# Patient Record
Sex: Female | Born: 1995 | Race: Black or African American | Hispanic: No | Marital: Single | Smoking: Former smoker
Health system: Southern US, Community
[De-identification: ages and names within clinical notes are randomized; demographics above are authoritative.]

## PROBLEM LIST (undated history)

## (undated) ENCOUNTER — Inpatient Hospital Stay: Payer: Self-pay

## (undated) DIAGNOSIS — Z789 Other specified health status: Secondary | ICD-10-CM

## (undated) HISTORY — PX: NO PAST SURGERIES: SHX2092

---
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
## 2015-01-28 ENCOUNTER — Other Ambulatory Visit: Payer: Self-pay | Admitting: Advanced Practice Midwife

## 2015-01-28 DIAGNOSIS — Z3689 Encounter for other specified antenatal screening: Secondary | ICD-10-CM

## 2015-02-18 ENCOUNTER — Ambulatory Visit
Admission: RE | Admit: 2015-02-18 | Discharge: 2015-02-18 | Disposition: A | Discharge status: Home / Self Care | Payer: Self-pay | Source: Ambulatory Visit | Attending: Advanced Practice Midwife | Admitting: Advanced Practice Midwife

## 2015-02-18 DIAGNOSIS — Z36 Encounter for antenatal screening of mother: Secondary | ICD-10-CM | POA: Insufficient documentation

## 2015-02-18 DIAGNOSIS — Z3689 Encounter for other specified antenatal screening: Secondary | ICD-10-CM

## 2015-03-29 ENCOUNTER — Emergency Department
Admission: EM | Admit: 2015-03-29 | Discharge: 2015-03-29 | Disposition: A | Discharge status: Home / Self Care | Payer: Medicaid Other | Attending: Emergency Medicine | Admitting: Emergency Medicine

## 2015-03-29 ENCOUNTER — Encounter: Payer: Self-pay | Admitting: *Deleted

## 2015-03-29 DIAGNOSIS — R05 Cough: Secondary | ICD-10-CM | POA: Diagnosis present

## 2015-03-29 DIAGNOSIS — J039 Acute tonsillitis, unspecified: Secondary | ICD-10-CM

## 2015-03-29 DIAGNOSIS — J069 Acute upper respiratory infection, unspecified: Secondary | ICD-10-CM | POA: Insufficient documentation

## 2015-03-29 LAB — POCT RAPID STREP A: STREPTOCOCCUS, GROUP A SCREEN (DIRECT): NEGATIVE

## 2015-03-29 MED ORDER — DEXTROMETHORPHAN HBR 15 MG/5ML PO SYRP: 10.0000 mL | ORAL_SOLUTION | Freq: Four times a day (QID) | ORAL | Status: DC | PRN

## 2015-03-29 MED ORDER — AZITHROMYCIN 250 MG PO TABS: ORAL_TABLET | ORAL | Status: DC

## 2015-03-29 NOTE — ED Notes (Signed)
Pt reports congestion and cough x 2 weeks. Pt also 6 months pregnant. No complaints with baby.

## 2015-03-29 NOTE — Discharge Instructions (Signed)
Upper Respiratory Infection, Adult Most upper respiratory infections (URIs) are a viral infection of the air passages leading to the lungs. A URI affects the nose, throat, and upper air passages. The most common type of URI is nasopharyngitis and is typically referred to as "the common cold." URIs run their course and usually go away on their own. Most of the time, a URI does not require medical attention, but sometimes a bacterial infection in the upper airways can follow a viral infection. This is called a secondary infection. Sinus and middle ear infections are common types of secondary upper respiratory infections. Bacterial pneumonia can also complicate a URI. A URI can worsen asthma and chronic obstructive pulmonary disease (COPD). Sometimes, these complications can require emergency medical care and may be life threatening.  CAUSES Almost all URIs are caused by viruses. A virus is a type of germ and can spread from one person to another.  RISKS FACTORS You may be at risk for a URI if:   You smoke.   You have chronic heart or lung disease.  You have a weakened defense (immune) system.   You are very young or very old.   You have nasal allergies or asthma.  You work in crowded or poorly ventilated areas.  You work in health care facilities or schools. SIGNS AND SYMPTOMS  Symptoms typically develop 2-3 days after you come in contact with a cold virus. Most viral URIs last 7-10 days. However, viral URIs from the influenza virus (flu virus) can last 14-18 days and are typically more severe. Symptoms may include:   Runny or stuffy (congested) nose.   Sneezing.   Cough.   Sore throat.   Headache.   Fatigue.   Fever.   Loss of appetite.   Pain in your forehead, behind your eyes, and over your cheekbones (sinus pain).  Muscle aches.  DIAGNOSIS  Your health care provider may diagnose a URI by:  Physical exam.  Tests to check that your symptoms are not due to  another condition such as:  Strep throat.  Sinusitis.  Pneumonia.  Asthma. TREATMENT  A URI goes away on its own with time. It cannot be cured with medicines, but medicines may be prescribed or recommended to relieve symptoms. Medicines may help:  Reduce your fever.  Reduce your cough.  Relieve nasal congestion. HOME CARE INSTRUCTIONS   Take medicines only as directed by your health care provider.   Gargle warm saltwater or take cough drops to comfort your throat as directed by your health care provider.  Use a warm mist humidifier or inhale steam from a shower to increase air moisture. This may make it easier to breathe.  Drink enough fluid to keep your urine clear or pale yellow.   Eat soups and other clear broths and maintain good nutrition.   Rest as needed.   Return to work when your temperature has returned to normal or as your health care provider advises. You may need to stay home longer to avoid infecting others. You can also use a face mask and careful hand washing to prevent spread of the virus.  Increase the usage of your inhaler if you have asthma.   Do not use any tobacco products, including cigarettes, chewing tobacco, or electronic cigarettes. If you need help quitting, ask your health care provider. PREVENTION  The best way to protect yourself from getting a cold is to practice good hygiene.   Avoid oral or hand contact with people with cold   symptoms.   Wash your hands often if contact occurs.  There is no clear evidence that vitamin C, vitamin E, echinacea, or exercise reduces the chance of developing a cold. However, it is always recommended to get plenty of rest, exercise, and practice good nutrition.  SEEK MEDICAL CARE IF:   You are getting worse rather than better.   Your symptoms are not controlled by medicine.   You have chills.  You have worsening shortness of breath.  You have brown or red mucus.  You have yellow or brown nasal  discharge.  You have pain in your face, especially when you bend forward.  You have a fever.  You have swollen neck glands.  You have pain while swallowing.  You have white areas in the back of your throat. SEEK IMMEDIATE MEDICAL CARE IF:   You have severe or persistent:  Headache.  Ear pain.  Sinus pain.  Chest pain.  You have chronic lung disease and any of the following:  Wheezing.  Prolonged cough.  Coughing up blood.  A change in your usual mucus.  You have a stiff neck.  You have changes in your:  Vision.  Hearing.  Thinking.  Mood. MAKE SURE YOU:   Understand these instructions.  Will watch your condition.  Will get help right away if you are not doing well or get worse.   This information is not intended to replace advice given to you by your health care provider. Make sure you discuss any questions you have with your health care provider.   Document Released: 11/21/2000 Document Revised: 10/12/2014 Document Reviewed: 09/02/2013 Elsevier Interactive Patient Education 2016 [employer]  

## 2015-03-29 NOTE — ED Provider Notes (Signed)
[facility] Emergency Department Provider Note  ____________________________________________  Time seen: Approximately 3:01 PM  I have reviewed the triage vital signs and the nursing notes.   HISTORY  Chief Complaint URI    HPI Rachael Wright is a 19 y.o. female who presents with having a chest cold with productive cough and sore throat for 2 weeks. Patient states that she is 6 months pregnant. Also complains of having a headache every day despite staying hydrated and taking Tylenol. Denies any abdominal pains or baby complaints.   History reviewed. No pertinent past medical history.  There are no active problems to display for this patient.   History reviewed. No pertinent past surgical history.  Current Outpatient Rx  Name  Route  Sig  Dispense  Refill  . azithromycin (ZITHROMAX Z-PAK) 250 MG tablet      Take 2 tablets (500 mg) on  Day 1,  followed by 1 tablet (250 mg) once daily on Days 2 through 5.   6 each   0   . cyclobenzaprine (FLEXERIL) 10 MG tablet   Oral   Take 1 tablet (10 mg total) by mouth 2 (two) times daily as needed for muscle spasms.   20 tablet   0   . dextromethorphan 15 MG/5ML syrup   Oral   Take 10 mLs (30 mg total) by mouth 4 (four) times daily as needed for cough.   120 mL   0     Allergies Review of patient's allergies indicates no known allergies.  No family history on file.  Social History Social History  Substance Use Topics  . Smoking status: Never Smoker   . Smokeless tobacco: Never Used  . Alcohol Use: No    Review of Systems Constitutional: No fever/chills Eyes: No visual changes. ENT: No sore throat. Cardiovascular: Denies chest pain. Respiratory: Denies shortness of breath. Gastrointestinal: No abdominal pain.  No nausea, no vomiting.  No diarrhea.  No constipation. Genitourinary: Negative for dysuria. Musculoskeletal: Negative for back pain. Skin: Negative for rash. Neurological: Negative  for headaches, focal weakness or numbness.  10-point ROS otherwise negative.  ____________________________________________   PHYSICAL EXAM:  VITAL SIGNS: ED Triage Vitals  Enc Vitals Group     BP 03/29/15 1444 111/62 mmHg     Pulse Rate 03/29/15 1444 78     Resp 03/29/15 1444 18     Temp 03/29/15 1444 98.5 F (36.9 C)     Temp Source 03/29/15 1444 Oral     SpO2 03/29/15 1444 99 %     Weight 03/29/15 1444 171 lb (77.565 kg)     Height 03/29/15 1444 5\' 7"  (1.702 m)     Head Cir --      Peak Flow --      Pain Score 03/29/15 1442 4     Pain Loc --      Pain Edu? --      Excl. in GC? --     Constitutional: Alert and oriented. Well appearing and in no acute distress. Eyes: Conjunctivae are normal. PERRL. EOMI. Head: Atraumatic. Nose: No congestion/rhinnorhea. Mouth/Throat: Mucous membranes are moist.  Oropharynx non-erythematous. Neck: No stridor.   Cardiovascular: Normal rate, regular rhythm. Grossly normal heart sounds.  Good peripheral circulation. Respiratory: Normal respiratory effort.  No retractions. Lungs CTAB. Gastrointestinal: Soft and nontender. No distention. No abdominal bruits. No CVA tenderness. Musculoskeletal: No lower extremity tenderness nor edema.  No joint effusions. Neurologic:  Normal speech and language. No gross focal neurologic deficits  are appreciated. No gait instability. Skin:  Skin is warm, dry and intact. No rash noted. Psychiatric: Mood and affect are normal. Speech and behavior are normal.  ____________________________________________   LABS (all labs ordered are listed, but only abnormal results are displayed)  Labs Reviewed - No data to display ____________________________________________   RADIOLOGY  Deferred due to pregnancy. ____________________________________________   PROCEDURES  Procedure(s) performed: None  Critical Care performed: No  ____________________________________________   INITIAL IMPRESSION / ASSESSMENT  AND PLAN / ED COURSE  Pertinent labs & imaging results that were available during my care of the patient were reviewed by me and considered in my medical decision making (see chart for details).  Acute upper respiratory infection. Rx given for Zithromax and Delsym cough syrup patient follow-up PCP or OB/GYN as needed. Patient voices no other emergency medical complaints at this visit. ____________________________________________   FINAL CLINICAL IMPRESSION(S) / ED DIAGNOSES  Final diagnoses:  Tonsillitis  URI, acute      Evangeline Dakin, PA-C 03/29/15 1601  Jeanmarie Plant, MD 03/29/15 [phone]

## 2015-03-29 NOTE — ED Notes (Signed)
Pt reports having "chest cold" with productive cough and sore throat for 2 weeks now.  Pt states she is 6 months pregnant. Pt also complaining of headache everyday, despite staying hydrated and taken Tylenol.

## 2015-05-30 ENCOUNTER — Observation Stay
Admission: EM | Admit: 2015-05-30 | Discharge: 2015-05-30 | Disposition: A | Discharge status: Home / Self Care | Payer: Medicaid Other | Attending: Obstetrics & Gynecology | Admitting: Obstetrics & Gynecology

## 2015-05-30 DIAGNOSIS — O479 False labor, unspecified: Secondary | ICD-10-CM | POA: Diagnosis present

## 2015-05-30 DIAGNOSIS — O26893 Other specified pregnancy related conditions, third trimester: Secondary | ICD-10-CM | POA: Diagnosis present

## 2015-05-30 DIAGNOSIS — Z3A35 35 weeks gestation of pregnancy: Secondary | ICD-10-CM | POA: Insufficient documentation

## 2015-05-30 DIAGNOSIS — M545 Low back pain, unspecified: Secondary | ICD-10-CM | POA: Diagnosis present

## 2015-05-30 LAB — URINALYSIS COMPLETE WITH MICROSCOPIC (ARMC ONLY)
BACTERIA UA: NONE SEEN
BILIRUBIN URINE: NEGATIVE
Glucose, UA: 50 mg/dL — AB
HGB URINE DIPSTICK: NEGATIVE
Ketones, ur: NEGATIVE mg/dL
LEUKOCYTES UA: NEGATIVE
NITRITE: NEGATIVE
PH: 7 (ref 5.0–8.0)
PROTEIN: NEGATIVE mg/dL
Specific Gravity, Urine: 1.016 (ref 1.005–1.030)

## 2015-05-30 MED ORDER — ONDANSETRON HCL 4 MG/2ML IJ SOLN: 4.0000 mg | Freq: Four times a day (QID) | INTRAMUSCULAR | Status: DC | PRN

## 2015-05-30 MED ORDER — ACETAMINOPHEN 325 MG PO TABS: 650.0000 mg | ORAL_TABLET | ORAL | Status: DC | PRN

## 2015-05-30 NOTE — Discharge Instructions (Signed)
Drink plenty of fluid and get plenty of rest. Call your provider for any other concerns °

## 2015-05-30 NOTE — OB Triage Note (Signed)
Ms. Marina Goodellerry here with c/o lower back pain and lower abdominal pain that has been occurring for 2 days. Called BP last night and was advised to come in but did not have a ride. Pt denies bleeding, LOF, buring with urination, reports positive fetal movement. Rates intermittent pain 7-8/10

## 2015-05-30 NOTE — Final Progress Note (Signed)
Physician Final Progress Note  Patient ID: Rachael Wright MRN: [id number] DOB/AGE: 125-Aug-1997 19 y.o.  Admit date: 05/30/2015 Admitting provider: Nadara Mustardobert P Tavis Kring, MD Discharge date: 05/30/2015  Admission Diagnoses: Abdominal Pain, Low Back Pain, Third Trimester  Discharge Diagnoses:  Principal Problem:   Low back pain during pregnancy in third trimester Active Problems:   Irregular contractions  Consults: None  Significant Findings/ Diagnostic Studies: labs: UA neg Exam- AF, VSS    Abd gravid, NT, ND    FHT 130s    Back no CVAT    Extr no edema    SVE 1/20/-3, Vtx  Procedures: A NST procedure was performed with FHR monitoring and a normal baseline established, appropriate time of 20-40 minutes of evaluation, and accels >2 seen w 15x15 characteristics.  Results show a REACTIVE NST.   Discharge Condition: good  Disposition: 01-Home or Self Care  Diet: Regular diet  Discharge Activity: Activity as tolerated     Medication List    ASK your doctor about these medications        azithromycin 250 MG tablet  Commonly known as:  ZITHROMAX Z-PAK  Take 2 tablets (500 mg) on  Day 1,  followed by 1 tablet (250 mg) once daily on Days 2 through 5.     cyclobenzaprine 10 MG tablet  Commonly known as:  FLEXERIL  Take 1 tablet (10 mg total) by mouth 2 (two) times daily as needed for muscle spasms.     dextromethorphan 15 MG/5ML syrup  Take 10 mLs (30 mg total) by mouth 4 (four) times daily as needed for cough.         Total time spent taking care of this patient: 15 minutes  Signed: Letitia LibraHARRIS,Louie Flenner PAUL 05/30/2015, 4:23 PM

## 2015-06-12 NOTE — L&D Delivery Note (Signed)
Obstetrical Delivery Note   Date of Delivery:   06/27/2015 Primary OB:   [facility] Gestational Age/EDD: [redacted]w[redacted]d (Dated by LMP) Antepartum complications: none  Delivered By:   Farrel Conners, CNM  Delivery Type:   spontaneous vaginal delivery  Procedure Details:   After a short second stage, patient had a spontaneous vaginal delivery of a vigorous female infant in vtx presentation with left nuchal arm. Apgars 8/9. Weight is pending. BAby placed on mother's abdomen and after delayed cord clamping, FOB cut cord. Baby placed skin to skin with mother. Spontaneous delivery of intact placenta and 3 vessel cord. Repair of second degree vaginal laceration extending to the lower left labia with 3-0 Chromic under epidural anesthesia. Anesthesia:    epidural Intrapartum complications: PROM requiring Pitocin augmentation. GBS:    negative Laceration:    2nd degree, vaginal and labial Episiotomy:    none Placenta:    Via active 3rd stage. To pathology: no Estimated Blood Loss:  350  Baby:    Liveborn female, Apgars 8/9, weight pending/ [address], [place], [place]

## 2015-06-27 ENCOUNTER — Inpatient Hospital Stay
Admission: EM | Admit: 2015-06-27 | Discharge: 2015-06-29 | DRG: 775 | Disposition: A | Discharge status: Home / Self Care | Payer: Medicaid Other | Attending: Obstetrics & Gynecology | Admitting: Obstetrics & Gynecology

## 2015-06-27 ENCOUNTER — Encounter: Payer: Self-pay | Admitting: *Deleted

## 2015-06-27 ENCOUNTER — Inpatient Hospital Stay: Payer: Medicaid Other | Admitting: Certified Registered Nurse Anesthetist

## 2015-06-27 DIAGNOSIS — O99344 Other mental disorders complicating childbirth: Secondary | ICD-10-CM | POA: Diagnosis present

## 2015-06-27 DIAGNOSIS — O99324 Drug use complicating childbirth: Secondary | ICD-10-CM | POA: Diagnosis present

## 2015-06-27 DIAGNOSIS — F1290 Cannabis use, unspecified, uncomplicated: Secondary | ICD-10-CM | POA: Diagnosis present

## 2015-06-27 DIAGNOSIS — Z87891 Personal history of nicotine dependence: Secondary | ICD-10-CM

## 2015-06-27 DIAGNOSIS — F329 Major depressive disorder, single episode, unspecified: Secondary | ICD-10-CM | POA: Diagnosis present

## 2015-06-27 DIAGNOSIS — Z91410 Personal history of adult physical and sexual abuse: Secondary | ICD-10-CM

## 2015-06-27 DIAGNOSIS — Z3A38 38 weeks gestation of pregnancy: Secondary | ICD-10-CM | POA: Diagnosis not present

## 2015-06-27 DIAGNOSIS — O4292 Full-term premature rupture of membranes, unspecified as to length of time between rupture and onset of labor: Principal | ICD-10-CM | POA: Diagnosis present

## 2015-06-27 HISTORY — DX: Other specified health status: Z78.9

## 2015-06-27 LAB — CBC
HEMATOCRIT: 33 % — AB (ref 35.0–47.0)
Hemoglobin: 10.6 g/dL — ABNORMAL LOW (ref 12.0–16.0)
MCH: 27.5 pg (ref 26.0–34.0)
MCHC: 32.2 g/dL (ref 32.0–36.0)
MCV: 85.3 fL (ref 80.0–100.0)
Platelets: 256 10*3/uL (ref 150–440)
RBC: 3.87 MIL/uL (ref 3.80–5.20)
RDW: 14.9 % — ABNORMAL HIGH (ref 11.5–14.5)
WBC: 9.2 10*3/uL (ref 3.6–11.0)

## 2015-06-27 LAB — RAPID HIV SCREEN (HIV 1/2 AB+AG)
HIV 1/2 Antibodies: NONREACTIVE
HIV-1 P24 ANTIGEN - HIV24: NONREACTIVE

## 2015-06-27 LAB — TYPE AND SCREEN
ABO/RH(D): AB POS
Antibody Screen: NEGATIVE

## 2015-06-27 LAB — URINE DRUG SCREEN, QUALITATIVE (ARMC ONLY)
Amphetamines, Ur Screen: NOT DETECTED
BENZODIAZEPINE, UR SCRN: NOT DETECTED
Barbiturates, Ur Screen: NOT DETECTED
CANNABINOID 50 NG, UR ~~LOC~~: NOT DETECTED
Cocaine Metabolite,Ur ~~LOC~~: NOT DETECTED
MDMA (Ecstasy)Ur Screen: NOT DETECTED
Methadone Scn, Ur: NOT DETECTED
Opiate, Ur Screen: NOT DETECTED
PHENCYCLIDINE (PCP) UR S: NOT DETECTED
Tricyclic, Ur Screen: NOT DETECTED

## 2015-06-27 LAB — CHLAMYDIA/NGC RT PCR (ARMC ONLY)
Chlamydia Tr: NOT DETECTED
N gonorrhoeae: NOT DETECTED

## 2015-06-27 LAB — ABO/RH: ABO/RH(D): AB POS

## 2015-06-27 MED ORDER — BUTORPHANOL TARTRATE 1 MG/ML IJ SOLN
1.0000 mg | INTRAMUSCULAR | Status: DC | PRN
  Administered 2015-06-27 (×2): 1 mg via INTRAVENOUS
  Filled 2015-06-27 (×2): qty 1

## 2015-06-27 MED ORDER — ONDANSETRON HCL 4 MG/2ML IJ SOLN: 4.0000 mg | INTRAMUSCULAR | Status: DC | PRN

## 2015-06-27 MED ORDER — LACTATED RINGERS IV SOLN
500.0000 mL | INTRAVENOUS | Status: DC | PRN
  Administered 2015-06-27: 500 mL via INTRAVENOUS

## 2015-06-27 MED ORDER — NALOXONE HCL 2 MG/2ML IJ SOSY
1.0000 ug/kg/h | PREFILLED_SYRINGE | INTRAVENOUS | Status: DC | PRN
  Filled 2015-06-27: qty 2

## 2015-06-27 MED ORDER — IBUPROFEN 600 MG PO TABS
600.0000 mg | ORAL_TABLET | Freq: Four times a day (QID) | ORAL | Status: DC
  Administered 2015-06-28 – 2015-06-29 (×6): 600 mg via ORAL
  Filled 2015-06-27 (×6): qty 1

## 2015-06-27 MED ORDER — LIDOCAINE HCL (PF) 1 % IJ SOLN
30.0000 mL | INTRAMUSCULAR | Status: DC | PRN
  Filled 2015-06-27: qty 30

## 2015-06-27 MED ORDER — NALBUPHINE HCL 10 MG/ML IJ SOLN: 5.0000 mg | Freq: Once | INTRAMUSCULAR | Status: DC | PRN

## 2015-06-27 MED ORDER — WITCH HAZEL-GLYCERIN EX PADS: 1.0000 "application " | MEDICATED_PAD | CUTANEOUS | Status: DC | PRN

## 2015-06-27 MED ORDER — MISOPROSTOL 200 MCG PO TABS
ORAL_TABLET | ORAL | Status: AC
  Filled 2015-06-27: qty 4

## 2015-06-27 MED ORDER — LANOLIN HYDROUS EX OINT: TOPICAL_OINTMENT | CUTANEOUS | Status: DC | PRN

## 2015-06-27 MED ORDER — ACETAMINOPHEN 325 MG PO TABS
ORAL_TABLET | ORAL | Status: AC
  Filled 2015-06-27: qty 2

## 2015-06-27 MED ORDER — OXYCODONE HCL 5 MG PO TABS: 5.0000 mg | ORAL_TABLET | ORAL | Status: DC | PRN

## 2015-06-27 MED ORDER — SODIUM CHLORIDE 0.9 % IJ SOLN: 3.0000 mL | INTRAMUSCULAR | Status: DC | PRN

## 2015-06-27 MED ORDER — ONDANSETRON HCL 4 MG/2ML IJ SOLN: 4.0000 mg | Freq: Three times a day (TID) | INTRAMUSCULAR | Status: DC | PRN

## 2015-06-27 MED ORDER — OXYTOCIN 10 UNIT/ML IJ SOLN
INTRAMUSCULAR | Status: AC
  Filled 2015-06-27: qty 2

## 2015-06-27 MED ORDER — ACETAMINOPHEN 325 MG PO TABS
650.0000 mg | ORAL_TABLET | ORAL | Status: DC | PRN
  Administered 2015-06-27: 650 mg via ORAL

## 2015-06-27 MED ORDER — DIPHENHYDRAMINE HCL 25 MG PO CAPS: 25.0000 mg | ORAL_CAPSULE | ORAL | Status: DC | PRN

## 2015-06-27 MED ORDER — TERBUTALINE SULFATE 1 MG/ML IJ SOLN: 0.2500 mg | Freq: Once | INTRAMUSCULAR | Status: DC | PRN

## 2015-06-27 MED ORDER — DOCUSATE SODIUM 100 MG PO CAPS
100.0000 mg | ORAL_CAPSULE | Freq: Two times a day (BID) | ORAL | Status: DC
  Administered 2015-06-27 – 2015-06-29 (×4): 100 mg via ORAL
  Filled 2015-06-27 (×4): qty 1

## 2015-06-27 MED ORDER — FENTANYL 2.5 MCG/ML W/ROPIVACAINE 0.2% IN NS 100 ML EPIDURAL INFUSION (ARMC-ANES)
EPIDURAL | Status: AC
  Administered 2015-06-27: 9 mL/h via EPIDURAL
  Filled 2015-06-27: qty 100

## 2015-06-27 MED ORDER — OXYTOCIN 40 UNITS IN LACTATED RINGERS INFUSION - SIMPLE MED
2.5000 [IU]/h | INTRAVENOUS | Status: DC
  Administered 2015-06-27: 2.5 [IU]/h via INTRAVENOUS
  Filled 2015-06-27: qty 1000

## 2015-06-27 MED ORDER — NALOXONE HCL 0.4 MG/ML IJ SOLN: 0.4000 mg | INTRAMUSCULAR | Status: DC | PRN

## 2015-06-27 MED ORDER — SIMETHICONE 80 MG PO CHEW: 80.0000 mg | CHEWABLE_TABLET | ORAL | Status: DC | PRN

## 2015-06-27 MED ORDER — NALBUPHINE HCL 10 MG/ML IJ SOLN: 5.0000 mg | INTRAMUSCULAR | Status: DC | PRN

## 2015-06-27 MED ORDER — DIPHENHYDRAMINE HCL 50 MG/ML IJ SOLN: 12.5000 mg | INTRAMUSCULAR | Status: DC | PRN

## 2015-06-27 MED ORDER — OXYTOCIN BOLUS FROM INFUSION
500.0000 mL | INTRAVENOUS | Status: DC
  Administered 2015-06-27: 500 mL via INTRAVENOUS

## 2015-06-27 MED ORDER — PRENATAL MULTIVITAMIN CH
1.0000 | ORAL_TABLET | Freq: Every day | ORAL | Status: DC
  Administered 2015-06-28 – 2015-06-29 (×2): 1 via ORAL
  Filled 2015-06-27 (×2): qty 1

## 2015-06-27 MED ORDER — DIBUCAINE 1 % RE OINT: 1.0000 "application " | TOPICAL_OINTMENT | RECTAL | Status: DC | PRN

## 2015-06-27 MED ORDER — IBUPROFEN 600 MG PO TABS
600.0000 mg | ORAL_TABLET | Freq: Four times a day (QID) | ORAL | Status: DC
Start: 2015-06-27 — End: 2015-06-27
  Administered 2015-06-27: 600 mg via ORAL
  Filled 2015-06-27: qty 1

## 2015-06-27 MED ORDER — LIDOCAINE HCL (PF) 1 % IJ SOLN
INTRAMUSCULAR | Status: AC
  Filled 2015-06-27: qty 30

## 2015-06-27 MED ORDER — OXYTOCIN 40 UNITS IN LACTATED RINGERS INFUSION - SIMPLE MED
1.0000 m[IU]/min | INTRAVENOUS | Status: DC
  Administered 2015-06-27: 1 m[IU]/min via INTRAVENOUS

## 2015-06-27 MED ORDER — BUPIVACAINE HCL (PF) 0.25 % IJ SOLN
INTRAMUSCULAR | Status: DC | PRN
  Administered 2015-06-27: 4 mL via EPIDURAL

## 2015-06-27 MED ORDER — LACTATED RINGERS IV SOLN
INTRAVENOUS | Status: DC
  Administered 2015-06-27: 125 mL/h via INTRAVENOUS

## 2015-06-27 MED ORDER — AMMONIA AROMATIC IN INHA
RESPIRATORY_TRACT | Status: AC
  Filled 2015-06-27: qty 10

## 2015-06-27 MED ORDER — BENZOCAINE-MENTHOL 20-0.5 % EX AERO: 1.0000 "application " | INHALATION_SPRAY | CUTANEOUS | Status: DC | PRN

## 2015-06-27 MED ORDER — ONDANSETRON HCL 4 MG/2ML IJ SOLN: 4.0000 mg | Freq: Four times a day (QID) | INTRAMUSCULAR | Status: DC | PRN

## 2015-06-27 MED ORDER — ONDANSETRON HCL 4 MG PO TABS: 4.0000 mg | ORAL_TABLET | ORAL | Status: DC | PRN

## 2015-06-27 MED ORDER — FENTANYL 2.5 MCG/ML W/ROPIVACAINE 0.2% IN NS 100 ML EPIDURAL INFUSION (ARMC-ANES): 10.0000 mL/h | EPIDURAL | Status: DC

## 2015-06-27 MED ORDER — LIDOCAINE-EPINEPHRINE (PF) 1.5 %-1:200000 IJ SOLN
INTRAMUSCULAR | Status: DC | PRN
  Administered 2015-06-27: 3 mL via EPIDURAL

## 2015-06-27 MED ORDER — LIDOCAINE HCL (PF) 1 % IJ SOLN
INTRAMUSCULAR | Status: DC | PRN
  Administered 2015-06-27: 3 mL

## 2015-06-27 MED ORDER — FERROUS SULFATE 325 (65 FE) MG PO TABS
325.0000 mg | ORAL_TABLET | Freq: Every day | ORAL | Status: DC
  Administered 2015-06-28 – 2015-06-29 (×2): 325 mg via ORAL
  Filled 2015-06-27 (×2): qty 1

## 2015-06-27 NOTE — Discharge Summary (Signed)
Physician Obstetric Discharge Summary  Patient ID: Rachael Wright MRN: [id number] DOB/AGE: 1996/05/14 20 y.o.   Date of Admission: 06/27/2015  Date of Discharge: 06/28/17  Admitting Diagnosis: Premature rupture of membrane at [redacted]w[redacted]d Secondary Diagnosis: none  Mode of Delivery: normal spontaneous vaginal delivery 06/27/2015       Discharge Diagnosis: Term intrauterine pregnancy delivered   Intrapartum Procedures: epidural, pitocin augmentation, placement of intrauterine catheter and AROM of forembag and repair of second degree vaginal-labial laceration   Post partum procedures: N/A  Complications: none   Brief Hospital Course  Rachael Wright a G1P1000 who had a SVD on 06/27/15;  for further details of this delivery, please refer to the delivery note.  Patient had an uncomplicated postpartum course.  By time of discharge on PPD#2, her pain was controlled on oral pain medications; she had appropriate lochia and was ambulating, voiding without difficulty and tolerating regular diet.  She was deemed stable for discharge to home.     Labs: CBC Latest Ref Rng 06/27/2015 03/01/2014  WBC 3.6 - 11.0 K/uL 9.2 7.5  Hemoglobin 12.0 - 16.0 g/dL 10.6(L) 12.9  Hematocrit 35.0 - 47.0 % 33.0(L) 38.8  Platelets 150 - 440 K/uL 256 278   HCT 1/17: 30.7  AB POS//MMR and Varivax UTD//TDAP UTD//Flu UTD  Physical exam:  Blood pressure 102/50, pulse 147, temperature 98.3 F (36.8 C), temperature source Oral, resp. rate 16, height 5' 7"  (1.702 m), weight 90.357 kg (199 lb 3.2 oz), last menstrual period 09/15/2014 General: alert and no distress Lochia: appropriate Abdomen: soft, NT Uterine Fundus: firm Extremities: No evidence of DVT seen on physical exam. No lower extremity edema.  Discharge Instructions:   Call office if you have any of the following: headache, visual changes, fever >100 F, chills, breast concerns, excessive vaginal bleeding, incision drainage or problems, leg pain or redness,  depression or any other concerns.   Activity: Do not lift > 10 lbs for 6 weeks.  No intercourse or tampons for 6 weeks.  No driving for 1-2 weeks.   Activity: Advance as tolerated. Pelvic rest for 6 weeks.  Also refer to Discharge Instructions Diet: Regular Medications:   Medication List    STOP taking these medications        azithromycin 250 MG tablet  Commonly known as:  ZITHROMAX Z-PAK     cyclobenzaprine 10 MG tablet  Commonly known as:  FLEXERIL     dextromethorphan 15 MG/5ML syrup      TAKE these medications        ibuprofen 600 MG tablet  Commonly known as:  ADVIL,MOTRIN  Take 1 tablet (600 mg total) by mouth every 6 (six) hours.     norgestimate-ethinyl estradiol 0.25-35 MG-MCG tablet  Commonly known as:  SPRINTEC 28  Take 1 tablet by mouth daily.     prenatal multivitamin Tabs tablet  Take 1 tablet by mouth daily at 12 noon.         Outpatient follow up:   Follow-up: 6 week postpartum visit with [facility] Postpartum contraception: oral contraceptives (estrogen/progesterone)  Discharged Condition: good  Discharged to: home   Newborn Data: Disposition:home with mother  Apgars: APGAR (1 MIN): 8   APGAR (5 MINS): 9   APGAR (10 MINS):    Baby Feeding: bottle

## 2015-06-27 NOTE — Progress Notes (Signed)
L&D Progress Note  20 yo  G1 P0 now 38.[redacted] weeks gestation who presented after SROM at 12 midnight last nite. AF was clear. On arrival was contracting and Cx was 3cm/70%/-2. Contractions had spaced out and she was just started on Pitocin. GBS negative  S: Not feeling contractions. Spaced out after taking Tylenol for a frontal headache  O: BP 111/55 mmHg  Pulse 90  Temp(Src) 98 F (36.7 C) (Oral)  Resp 16  Ht 5\' 7"  (1.702 m)  Wt 90.357 kg (199 lb 3.2 oz)  BMI 31.19 kg/m2  LMP 09/15/2014 (Approximate)  General: comforatable, in NAD FHR: 145 with accelerations to 160s to 170, moderate variability Contractions: difficult seeing contractions with external monitor Cervix: 3/60%/-1 to -2/vtx. AROM forebag for moderate amt clear fluid IUPC inserted: contractions q2-3 min apart on 1 miu/min Pitocin  A: IUP at 38.2 weeks with PROM, and no cervical change over 8 hours  P: Continue Pitocin and titrate to 200-250 mvus Monitor fetal well being and cx progress.  Farrel ConnersGUTIERREZ, Jazzalynn Rhudy, CNM
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
## 2015-06-27 NOTE — Anesthesia Procedure Notes (Signed)
Epidural Patient location during procedure: OB Start time: 06/27/2015 10:31 AM End time: 06/27/2015 11:05 AM  Staffing Anesthesiologist: Rosaria FerriesPISCITELLO, JOSEPH K Resident/CRNA: Malva CoganBEANE, Lendell Gallick Performed by: resident/CRNA   Preanesthetic Checklist Completed: patient identified, site marked, surgical consent, pre-op evaluation, timeout performed, IV checked, risks and benefits discussed and monitors and equipment checked  Epidural Patient position: sitting Prep: Betadine Patient monitoring: heart rate, cardiac monitor, continuous pulse ox and blood pressure Approach: midline Location: L3-L4 Injection technique: LOR saline  Needle:  Needle type: Tuohy  Needle gauge: 18 G Needle length: 9 cm and 9 Needle insertion depth: 6 cm Catheter type: closed end flexible Catheter size: 20 Guage Catheter at skin depth: 11 cm Test dose: negative and 1.5% lidocaine with Epi 1:200 K  Assessment Sensory level: T10 Events: blood not aspirated, injection not painful, no injection resistance, negative IV test and no paresthesia  Additional Notes   Patient tolerated the insertion well without complications.Reason for block:procedure for pain

## 2015-06-27 NOTE — Anesthesia Preprocedure Evaluation (Addendum)
Anesthesia Evaluation  Patient identified by MRN, date of birth, ID band Patient awake    Reviewed: Allergy & Precautions, NPO status , Patient's Chart, lab work & pertinent test results  Airway Mallampati: II  TM Distance: >3 FB     Dental  (+) Teeth Intact   Pulmonary former smoker,           Cardiovascular      Neuro/Psych    GI/Hepatic   Endo/Other    Renal/GU      Musculoskeletal   Abdominal   Peds  Hematology   Anesthesia Other Findings   Reproductive/Obstetrics (+) Pregnancy                            Anesthesia Physical Anesthesia Plan  ASA: II  Anesthesia Plan: Epidural   Post-op Pain Management:    Induction:   Airway Management Planned:   Additional Equipment:   Intra-op Plan:   Post-operative Plan:   Informed Consent:   Dental Advisory Given  Plan Discussed with: Anesthesiologist and CRNA  Anesthesia Plan Comments:        Anesthesia Quick Evaluation

## 2015-06-28 LAB — CBC
HEMATOCRIT: 30.7 % — AB (ref 35.0–47.0)
Hemoglobin: 10.1 g/dL — ABNORMAL LOW (ref 12.0–16.0)
MCH: 28.3 pg (ref 26.0–34.0)
MCHC: 32.8 g/dL (ref 32.0–36.0)
MCV: 86.2 fL (ref 80.0–100.0)
PLATELETS: 193 10*3/uL (ref 150–440)
RBC: 3.56 MIL/uL — AB (ref 3.80–5.20)
RDW: 15 % — ABNORMAL HIGH (ref 11.5–14.5)
WBC: 10.2 10*3/uL (ref 3.6–11.0)

## 2015-06-28 LAB — RPR: RPR: NONREACTIVE

## 2015-06-28 NOTE — Anesthesia Postprocedure Evaluation (Signed)
Anesthesia Post Note  Patient: Geraldin Gropp  Procedure(s) Performed: * No procedures listed *  Patient location during evaluation: Mother Baby Anesthesia Type: Epidural Level of consciousness: awake and alert, oriented and patient cooperative Pain management: pain level controlled Vital Signs Assessment: post-procedure vital signs reviewed and stable Respiratory status: spontaneous breathing Cardiovascular status: blood pressure returned to baseline and stable Postop Assessment: no headache, epidural receding, patient able to bend at knees and no signs of nausea or vomiting Anesthetic complications: no    Last Vitals:  Filed Vitals:   06/28/15 0735 06/28/15 0803  BP:    Pulse: 82   Temp: 37 C 37.2 C  Resp: 18     Last Pain:  Filed Vitals:   06/28/15 0850  PainSc: 0-No pain                 Starling Manns

## 2015-06-28 NOTE — Progress Notes (Signed)
  Postpartum Day 1  Subjective: no complaints, up ad lib and tolerating PO  Objective: Blood pressure 101/52, pulse 98, temperature 99.1 F (37.3 C), temperature source Oral, resp. rate 18, height  (1.702 m), weight 199 lb 3.2 oz (90.357 kg), last menstrual period 09/15/2014, SpO2 100 %  Physical Exam:  General: alert and cooperative Lochia: appropriate Uterine Fundus: firm Incision: N/A DVT Evaluation: No evidence of DVT seen on physical exam. Abdomen: soft, NT   Recent Labs  06/27/15 0522 06/28/15 0609  HGB 10.6* 10.1*  HCT 33.0* 30.7*    Assessment PPD #1, SVD  Plan: Discharge tomorrow and Continue PP care  Feeding: breast Contraception: minipill RI/VI TDAP UTD Flu: UTD Blood Type: AB +   Marta Antu, [place] 06/28/2015, 12:08 PM

## 2015-06-29 MED ORDER — NORGESTIMATE-ETH ESTRADIOL 0.25-35 MG-MCG PO TABS: 1.0000 | ORAL_TABLET | Freq: Every day | ORAL | Status: AC

## 2015-06-29 MED ORDER — IBUPROFEN 600 MG PO TABS: 600.0000 mg | ORAL_TABLET | Freq: Four times a day (QID) | ORAL | Status: AC

## 2015-06-29 NOTE — Discharge Instructions (Signed)
Discharge instructions:   Call office if you have any of the following: headache, visual changes, fever >100 F, chills, breast concerns, excessive vaginal bleeding, incision drainage or problems, leg pain or redness, depression or any other cBleeding: Your bleeding could continue up to 6 weeks, the flow should gradually decrease and the color should become dark then lightened over the next couple of weeks. If you notice you are bleeding heavily or passing clots larger than the size of your fist, PLEASE call your physician. No TAMPONS, DOUCHING, ENEMAS OR SEXUAL INTERCOURSE for 6 weeks.   Stitches: Shower daily with mild soap and water. Stitches will dissolve over the next couple of weeks, if you experience any discomfort in the vaginal area you may sit in warm water 15-20 minutes, 3-4 times per day. Just enough water to cover vaginal area.   AfterPains: This is the uterus contracting back to its normal position and size. Use medications prescribed or recommended by your physician to help relieve this discomfort.   Bowels/Hemorrhoids: Drink plenty of water and stay active. Increase fiber, fresh fruits and vegetables in your diet.   Rest/Activity: Rest when the baby is resting  Bathing: Shower daily!  Diet: Continue to eat extra calories until your follow up visit to help replenish nutrients and vitamins. If breastfeeding eat an extra [phone] and increase your fluid intake to 12 glasses a day.   Contraception: Consult with your physician on what method of birth control you would like to use.   Postpartum "BLUES": It is common to emotional days after delivery, however if it persist for greater than 2 weeks or if you feel concerned please let your physician know immediately. This is hormone driven and nothing you can control so please let someone know how you feel.  Follow Up Visit: Please schedule a follow up visit with your physician. oncerns.   Activity: Do not lift > 10 lbs for 6 weeks.  No  intercourse or tampons for 6 weeks.  No driving for 1-2 weeks.

## 2015-06-29 NOTE — Progress Notes (Signed)
Patient understands all discharge instructions and the need to make follow up appointments. Patient discharge via wheelchair with auxillary. 

## 2016-08-29 IMAGING — US US OB COMP +14 WK
1 series · 13 of 28 positions shown · non-contrast
Comparison: none

CLINICAL DATA: Nineteen weeks 6 days by LMP of 10/02/2014. EDC is
07/09/2015. Low risk anatomy scan. 19-year-old.

EXAM:
OBSTETRIC 14+ WK ULTRASOUND

[Series 1: us ob comp +14 wk · 0.21mm/px · 13 of 109 slices shown]
[im 5/109]
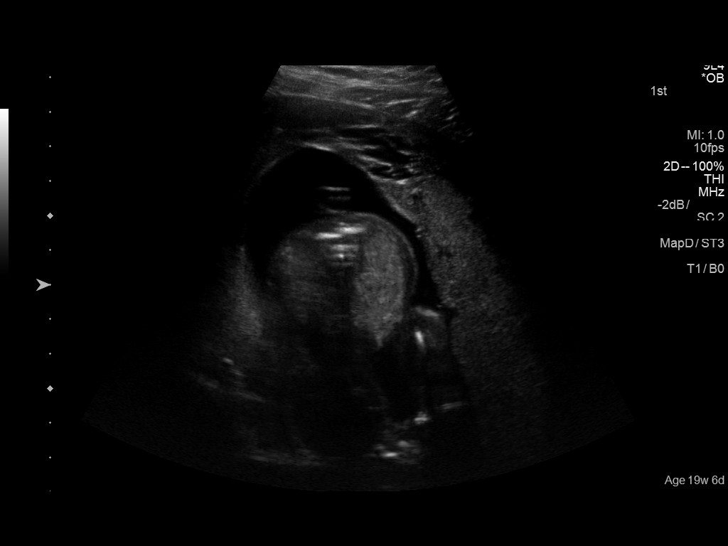
[im 13/109]
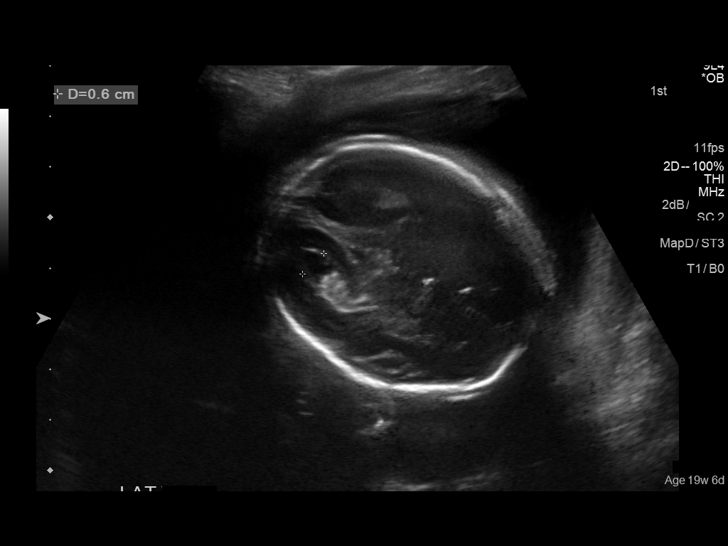
[im 21/109]
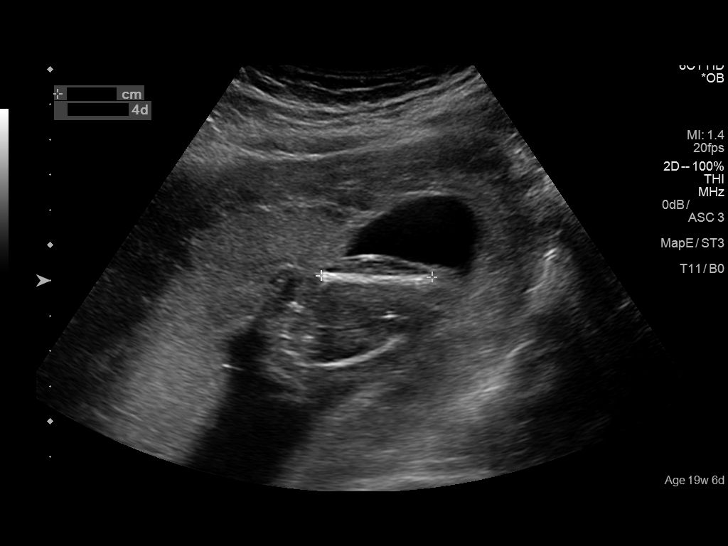
[im 29/109]
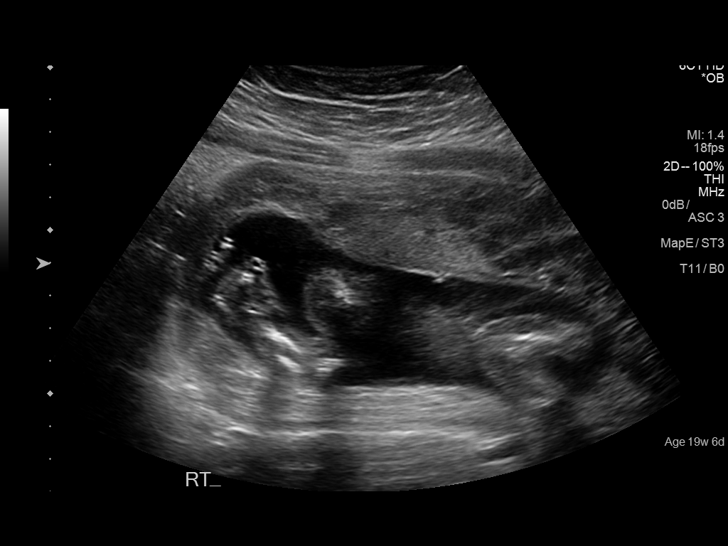
[im 37/109]
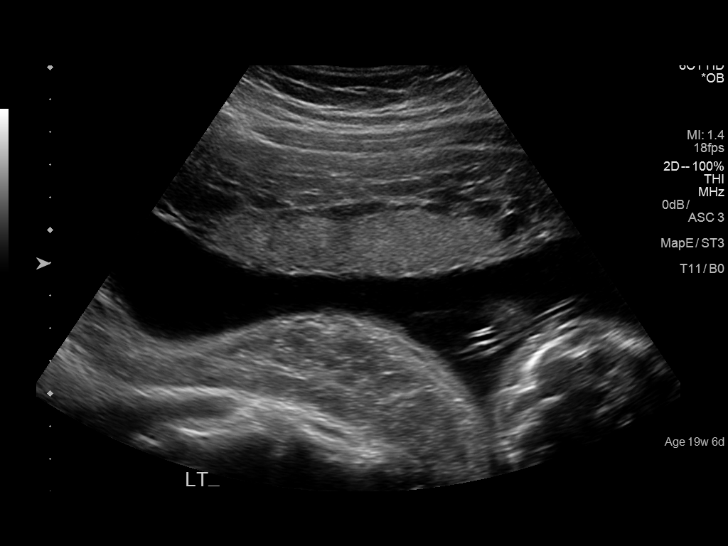
[im 45/109]
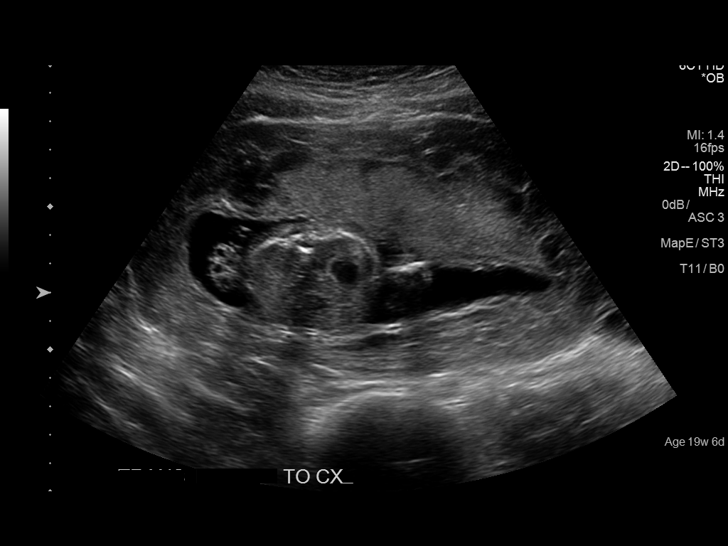
[im 57/109]
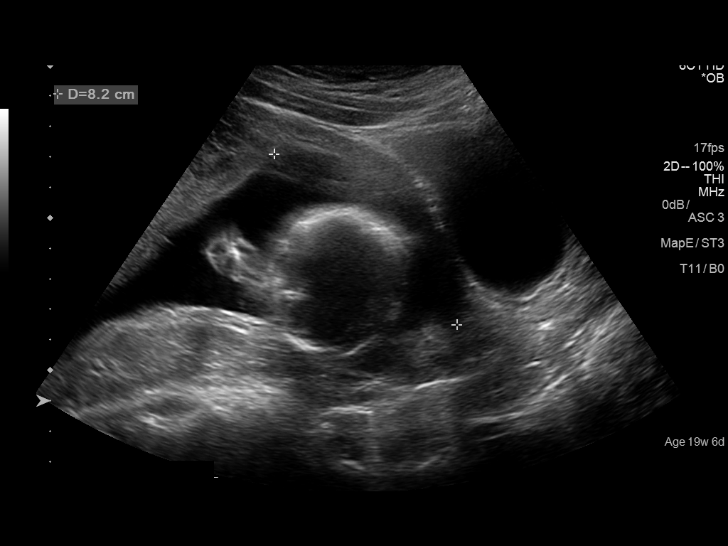
[im 65/109]
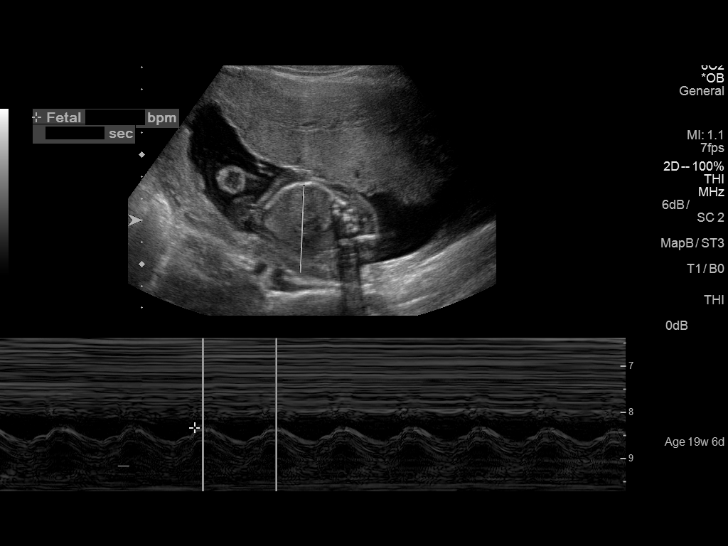
[im 73/109]
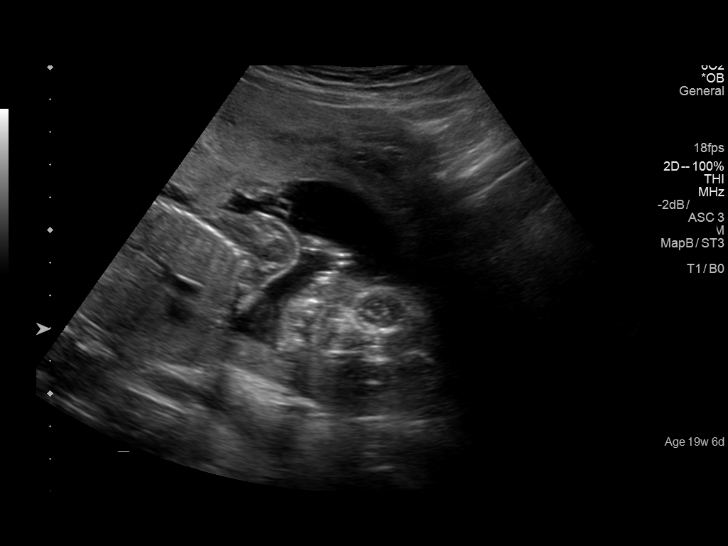
[im 81/109]
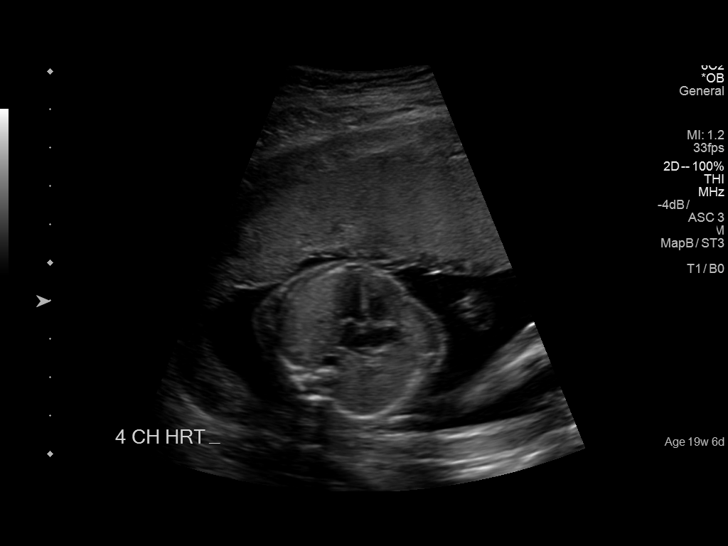
[im 89/109]
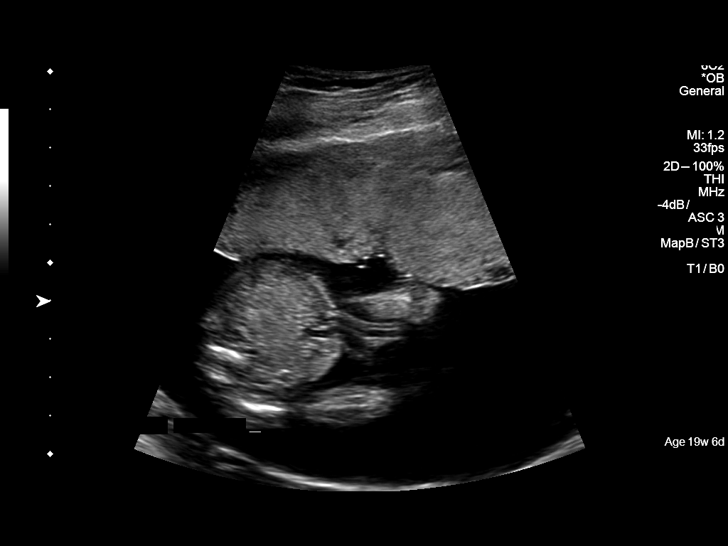
[im 97/109]
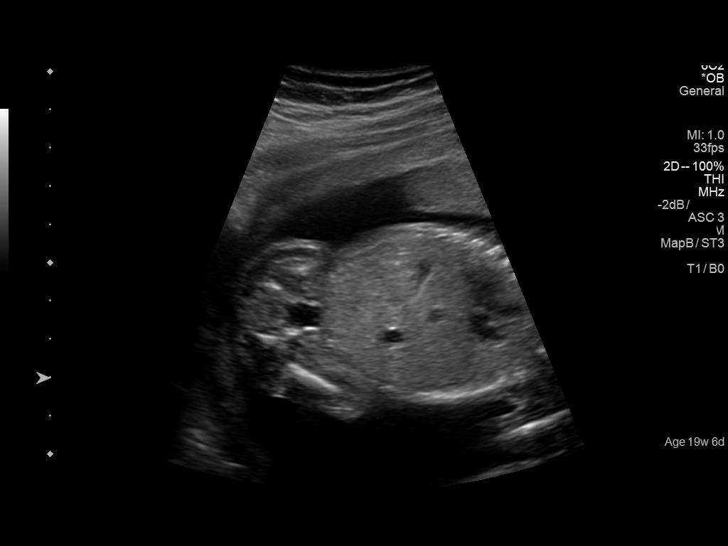
[im 105/109]
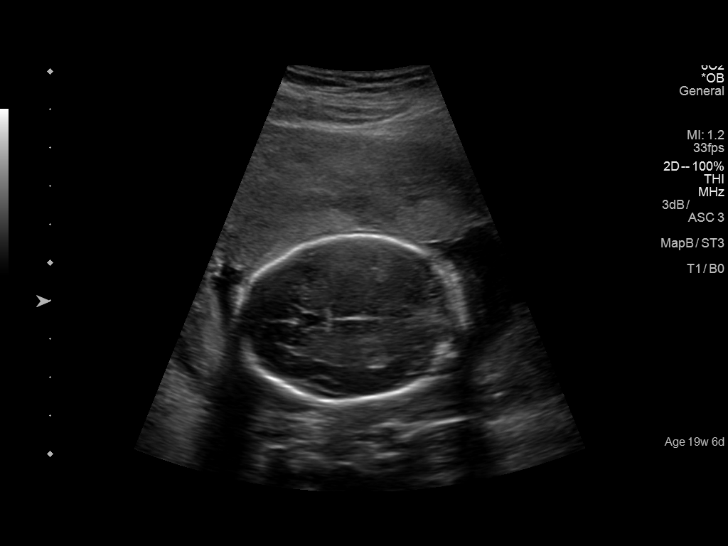

[13 of 28 positions shown; findings below may reference images not displayed]

FINDINGS: Number of Fetuses: 1

Heart Rate:  140 bpm

Movement: Present

Presentation: Cephalic

Previa: None

Placental Location: Anterior

Amniotic Fluid (Subjective): Normal

Amniotic Fluid (Objective):

Vertical pocket 4.0cm

FETAL BIOMETRY

BPD:  4.6cm 19w 6d

HC:    16.7cm  19w   3d

AC:   14.6cm  19w   6d

FL:   3.1cm  19w   4d

Current Mean GA: 19w 5d              US EDC: 07/10/2015

FETAL ANATOMY

Lateral Ventricles: Visualized

Thalami/CSP: Visualized

Posterior Fossa:  Visualized

Nuchal Region: Visualized    NFT= 4mm

Upper Lip: Visualized

Spine: Visualized

4 Chamber Heart on Left: Visualized

LVOT: Visualized

RVOT: Visualized

Stomach on Left: Visualized

3 Vessel Cord: Visualized

Cord Insertion site: Visualized

Kidneys: Visualized

Bladder: Visualized

Extremities: Visualized

Sex: Visualized female

Technically difficult due to: NA

Maternal Findings:

Cervix:  Closed, 3.7 cm.
IMPRESSION: 1. Single living intrauterine fetus in cephalic presentation.
2. Amniotic fluid volume is normal.
3. Size and dates correlate well.
4. No anomalies identified.
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
